# Patient Record
Sex: Female | Born: 1970 | Race: Black or African American | Hispanic: No | Marital: Single | Smoking: Current every day smoker
Health system: Southern US, Community
[De-identification: ages and names within clinical notes are randomized; demographics above are authoritative.]

## PROBLEM LIST (undated history)

## (undated) DIAGNOSIS — I10 Essential (primary) hypertension: Secondary | ICD-10-CM

## (undated) HISTORY — PX: OTHER SURGICAL HISTORY: SHX169

## (undated) HISTORY — PX: TUBAL LIGATION: SHX77

---

## 2004-07-10 ENCOUNTER — Encounter (INDEPENDENT_AMBULATORY_CARE_PROVIDER_SITE_OTHER): Payer: Self-pay | Admitting: *Deleted

## 2004-07-31 ENCOUNTER — Ambulatory Visit: Payer: Self-pay | Admitting: Family Medicine

## 2004-07-31 ENCOUNTER — Other Ambulatory Visit: Admission: RE | Admit: 2004-07-31 | Discharge: 2004-07-31 | Payer: Self-pay | Admitting: Family Medicine

## 2004-12-12 ENCOUNTER — Emergency Department (HOSPITAL_COMMUNITY): Admission: EM | Admit: 2004-12-12 | Discharge: 2004-12-12 | Payer: Self-pay | Admitting: Family Medicine

## 2004-12-19 ENCOUNTER — Ambulatory Visit: Payer: Self-pay | Admitting: Family Medicine

## 2005-01-04 ENCOUNTER — Emergency Department (HOSPITAL_COMMUNITY): Admission: EM | Admit: 2005-01-04 | Discharge: 2005-01-04 | Payer: Self-pay | Admitting: Family Medicine

## 2005-01-10 ENCOUNTER — Emergency Department (HOSPITAL_COMMUNITY): Admission: EM | Admit: 2005-01-10 | Discharge: 2005-01-10 | Payer: Self-pay | Admitting: Emergency Medicine

## 2005-06-13 ENCOUNTER — Ambulatory Visit: Payer: Self-pay | Admitting: Family Medicine

## 2005-10-18 ENCOUNTER — Ambulatory Visit: Payer: Self-pay | Admitting: Family Medicine

## 2005-10-18 ENCOUNTER — Other Ambulatory Visit: Admission: RE | Admit: 2005-10-18 | Discharge: 2005-10-18 | Payer: Self-pay | Admitting: Family Medicine

## 2005-12-24 ENCOUNTER — Emergency Department (HOSPITAL_COMMUNITY): Admission: EM | Admit: 2005-12-24 | Discharge: 2005-12-24 | Payer: Self-pay | Admitting: Family Medicine

## 2006-11-06 DIAGNOSIS — F17200 Nicotine dependence, unspecified, uncomplicated: Secondary | ICD-10-CM | POA: Insufficient documentation

## 2006-11-06 DIAGNOSIS — F3289 Other specified depressive episodes: Secondary | ICD-10-CM | POA: Insufficient documentation

## 2006-11-06 DIAGNOSIS — F329 Major depressive disorder, single episode, unspecified: Secondary | ICD-10-CM

## 2006-11-07 ENCOUNTER — Encounter (INDEPENDENT_AMBULATORY_CARE_PROVIDER_SITE_OTHER): Payer: Self-pay | Admitting: *Deleted

## 2007-10-30 ENCOUNTER — Emergency Department (HOSPITAL_COMMUNITY): Admission: EM | Admit: 2007-10-30 | Discharge: 2007-10-30 | Payer: Self-pay | Admitting: Emergency Medicine

## 2007-11-06 ENCOUNTER — Ambulatory Visit: Payer: Self-pay | Admitting: Internal Medicine

## 2015-10-29 ENCOUNTER — Encounter (HOSPITAL_COMMUNITY): Payer: Self-pay | Admitting: Emergency Medicine

## 2015-10-29 ENCOUNTER — Emergency Department (HOSPITAL_COMMUNITY)
Admission: EM | Admit: 2015-10-29 | Discharge: 2015-10-29 | Disposition: A | Discharge status: Home / Self Care | Payer: Managed Care, Other (non HMO) | Source: Home / Self Care | Attending: Family Medicine | Admitting: Family Medicine

## 2015-10-29 ENCOUNTER — Emergency Department (INDEPENDENT_AMBULATORY_CARE_PROVIDER_SITE_OTHER): Payer: Managed Care, Other (non HMO)

## 2015-10-29 DIAGNOSIS — M533 Sacrococcygeal disorders, not elsewhere classified: Secondary | ICD-10-CM | POA: Diagnosis not present

## 2015-10-29 DIAGNOSIS — S52201A Unspecified fracture of shaft of right ulna, initial encounter for closed fracture: Secondary | ICD-10-CM

## 2015-10-29 DIAGNOSIS — M25531 Pain in right wrist: Secondary | ICD-10-CM | POA: Diagnosis not present

## 2015-10-29 DIAGNOSIS — M25532 Pain in left wrist: Secondary | ICD-10-CM

## 2015-10-29 HISTORY — DX: Essential (primary) hypertension: I10

## 2015-10-29 MED ORDER — KETOROLAC TROMETHAMINE 30 MG/ML IJ SOLN
INTRAMUSCULAR | Status: AC
  Filled 2015-10-29: qty 1

## 2015-10-29 MED ORDER — NAPROXEN 500 MG PO TABS: 500.0000 mg | ORAL_TABLET | Freq: Two times a day (BID) | ORAL | Status: DC

## 2015-10-29 MED ORDER — KETOROLAC TROMETHAMINE 30 MG/ML IJ SOLN
30.0000 mg | Freq: Once | INTRAMUSCULAR | Status: AC
  Administered 2015-10-29: 30 mg via INTRAMUSCULAR

## 2015-10-29 NOTE — Discharge Instructions (Signed)
It is a pleasure to see you today.  The x-ray shows a fracture of the ulnar bone in your right wrist.  Splint to right wrist, follow up with Dr Coley/Hand Surgery in 1 week. Call for appointment.  Naproxen  tablets, take 1 tablet by mouth every 12 hours with something to eat.

## 2015-10-29 NOTE — ED Provider Notes (Addendum)
CSN: [id number]     Arrival date & time 10/29/15  1340 History   First MD Initiated Contact with Patient 10/29/15 1457     Chief Complaint  Patient presents with  . Wrist Pain  . Tailbone Pain  . Fall   (Consider location/radiation/quality/duration/timing/severity/associated sxs/prior Treatment) Patient is a 45 y.o. female presenting with wrist pain and fall. The history is provided by the patient. No language interpreter was used.  Wrist Pain  Fall  patient presents for bilateral wrist pain and coccygeal pain, which began after falling while roller skating at 5pm yesterday.  Fell backward, put hands back and fell onto backside.  Onset immediate pain in coccyx and R wrist; the pain in L wrist developed later in evening. Ice, tylenol and ibuprofen rotating since that time.  Today complains of continued pain both wrists, however R wrist is markedly worse than the L (but somewhat better than had been yesterday).   PMHx HTN, recently started on lisinopril for hypertension. Admitted to [facility] 2 weeks ago for walking pneumonia.  Reports that she has no medication allergies. Denies any known kidney disease, recent labs while at [facility].   Distant history fx R fifth metacarpal fracture, never required surgery.   Past Medical History  Diagnosis Date  . Hypertension    History reviewed. No pertinent past surgical history. History reviewed. No pertinent family history. Social History  Substance Use Topics  . Smoking status: Current Every Day Smoker -- 0.50 packs/day for 15 years    Types: Cigarettes  . Smokeless tobacco: Never Used  . Alcohol Use: No   OB History    No data available     Review of Systems  Constitutional: Negative for fever, chills, appetite change and fatigue.  HENT: Negative for congestion.   Respiratory: Negative for chest tightness.   Neurological: Negative for weakness.       Negative for urinary or bowel incontinence.   No loss of  strength in legs, no radiation of pain from back to legs.  No weakness in lower extremities. No saddle anesthesia.   All other systems reviewed and are negative.   Allergies  Review of patient's allergies indicates no known allergies.  Home Medications   Prior to Admission medications   Medication Sig Start Date End Date Taking? Authorizing Provider  budesonide-formoterol (SYMBICORT) 160-4.5 MCG/ACT inhaler Inhale 2 puffs into the lungs 2 (two) times daily.   Yes Historical Provider, MD  lamoTRIgine (LAMICTAL) 100 MG tablet Take 100 mg by mouth daily.   Yes Historical Provider, MD  lisinopril (PRINIVIL,ZESTRIL) 5 MG tablet Take 5 mg by mouth daily.   Yes Historical Provider, MD   Meds Ordered and Administered this Visit   Medications  ketorolac (TORADOL) 30 MG/ML injection 30 mg (not administered)    BP 162/101 mmHg  Pulse 94  Temp(Src) 98.6 F (37 C) (Oral)  Resp 17  SpO2 100%  LMP 10/18/2015 (Exact Date) No data found.   Physical Exam  Constitutional: She appears well-developed and well-nourished. No distress.  HENT:  Head: Normocephalic and atraumatic.  Neck: Normal range of motion. Neck supple.  Musculoskeletal:  RIGHT wrist with notable edema, unable to actively or passively pronate/supinate the wrist. Tenderness to palpate anatomic snuff box. Palpable radial pulses bilaterally.   LEFT wrist with slight edema of the wrist, less so than the right. Able to actively pronate/supinate the wrist.   Sensation in distal fingertips grossly intact and symmetric bilaterally. Hand grip stronger in L than R,  limitation due to pain.   COCCYGEAL tenderness with palpation over coccyx.   Able to stand/sit without limitation; strength hip flexion full and symmetric. Patellar DTRs 1+ symmetrically.   Neurological: She is alert.  Skin: She is not diaphoretic.    ED Course  Procedures (including critical care time)  Labs Review Labs Reviewed - No data to display  Imaging  Review No results found.   Visual Acuity Review  Right Eye Distance:   Left Eye Distance:   Bilateral Distance:    Right Eye Near:   Left Eye Near:    Bilateral Near:         MDM   1. Bilateral wrist pain   2. Coccygeal pain, acute    Patient s/p fall while skating, tenderness over anatomic snuff box bilaterally. X-rays of bilat wrists: x-ray shows nondisplaced R distal ulnar fracture; films reviewed by me.  Discussed case with Dr Izora Ribas by phone; plan for splint placement and follow up in his office in about 1 week.   Coccygeal pain, localized and without LE radiating symptoms.    Bilateral splints and injection administered.   Barbaraann Barthel, MD 10/29/15 1516  Barbaraann Barthel, MD 10/29/15 1610  Barbaraann Barthel, MD 01/26/16 1250

## 2015-10-29 NOTE — ED Notes (Signed)
The patient presented to the [facility] with a complaint of bilateral wrist pain and tailbone pain secondary to a fall that occurred today. The patient stated that she was skating and fell backwards and caught herself with both arms.

## 2017-02-09 IMAGING — DX DG WRIST COMPLETE 3+V*L*
4 series · 4 of 4 positions shown · non-contrast
Comparison: None.

CLINICAL DATA: Fell backwards yesterday while roller-skating with
bilateral wrist pain.

EXAM:
LEFT WRIST - COMPLETE 3+ VIEW

[wrist pa]
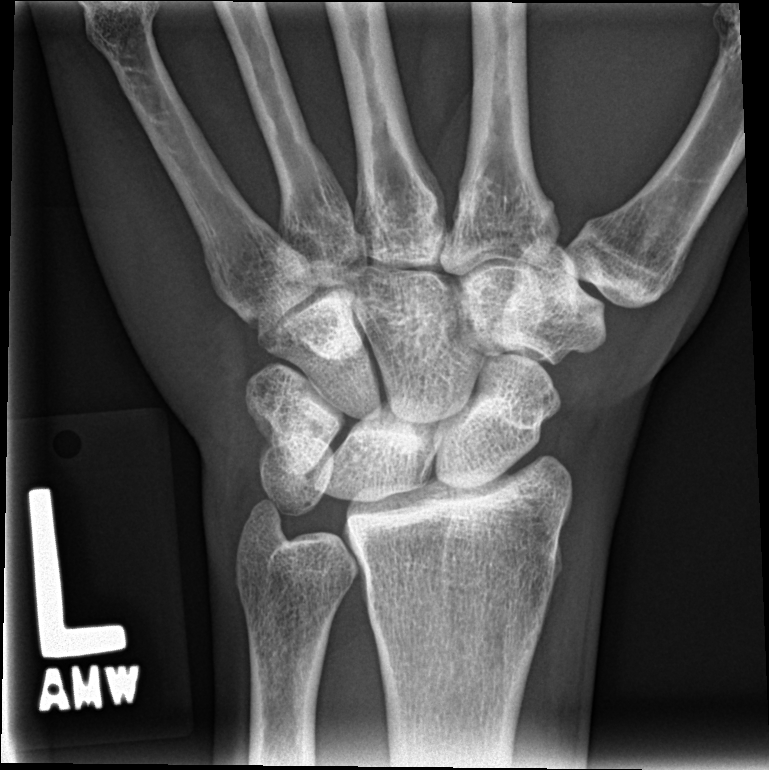

[wrist navicular]
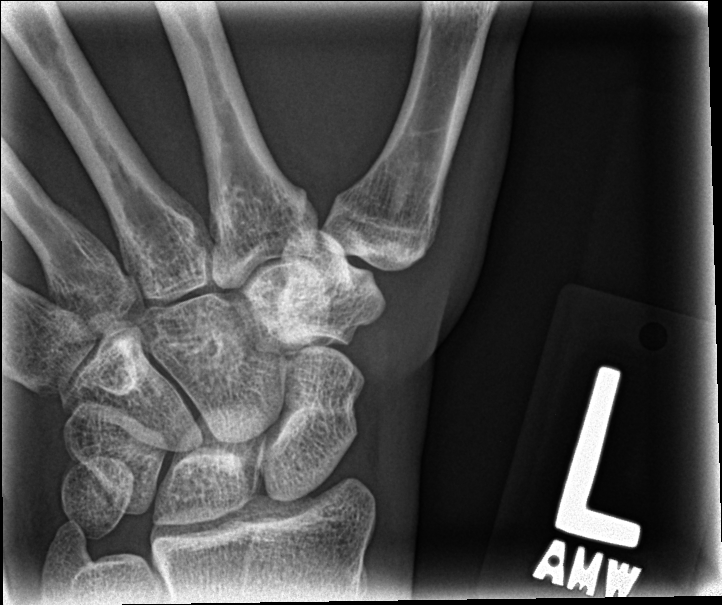

[wrist obl]
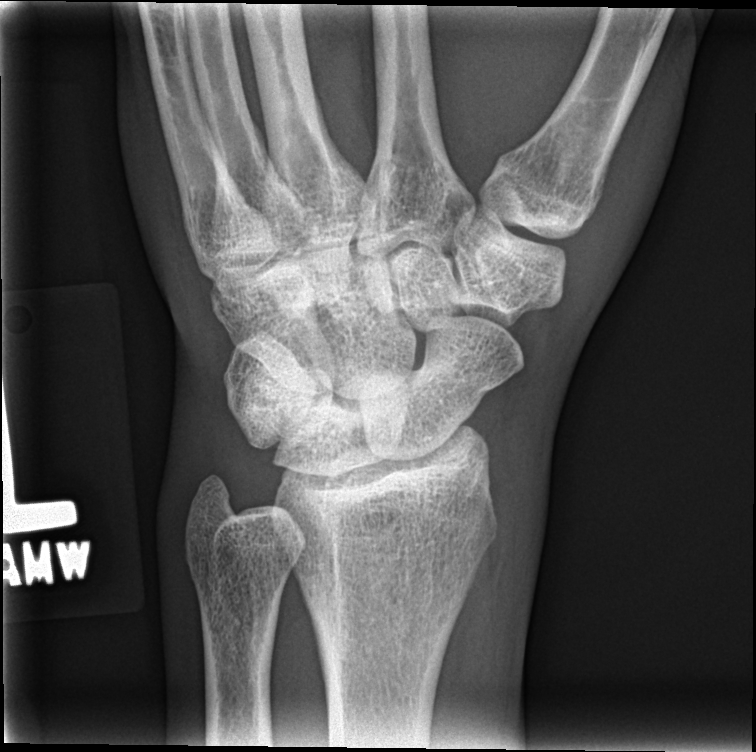

[wrist lat]
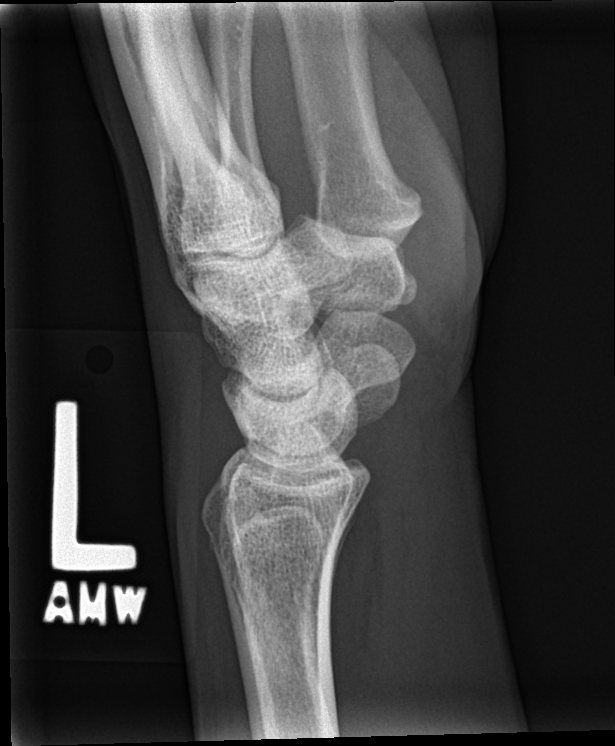

[4 of 4 positions shown; findings below may reference images not displayed]

FINDINGS: Mild degenerative change over the radiocarpal joint. No evidence of
fracture dislocation.
IMPRESSION: No acute findings.

## 2020-08-30 ENCOUNTER — Emergency Department (HOSPITAL_COMMUNITY)
Admission: EM | Admit: 2020-08-30 | Discharge: 2020-08-30 | Disposition: A | Discharge status: Home / Self Care | Payer: Medicaid - Out of State | Attending: Emergency Medicine | Admitting: Emergency Medicine

## 2020-08-30 ENCOUNTER — Other Ambulatory Visit: Payer: Self-pay

## 2020-08-30 ENCOUNTER — Encounter (HOSPITAL_COMMUNITY): Payer: Self-pay | Admitting: Emergency Medicine

## 2020-08-30 DIAGNOSIS — R0602 Shortness of breath: Secondary | ICD-10-CM | POA: Diagnosis present

## 2020-08-30 DIAGNOSIS — Z79899 Other long term (current) drug therapy: Secondary | ICD-10-CM | POA: Diagnosis not present

## 2020-08-30 DIAGNOSIS — J441 Chronic obstructive pulmonary disease with (acute) exacerbation: Secondary | ICD-10-CM | POA: Insufficient documentation

## 2020-08-30 DIAGNOSIS — F17210 Nicotine dependence, cigarettes, uncomplicated: Secondary | ICD-10-CM | POA: Insufficient documentation

## 2020-08-30 DIAGNOSIS — I10 Essential (primary) hypertension: Secondary | ICD-10-CM | POA: Diagnosis not present

## 2020-08-30 MED ORDER — PREDNISONE 20 MG PO TABS
60.0000 mg | ORAL_TABLET | Freq: Once | ORAL | Status: AC
Start: 2020-08-30 — End: 2020-08-30
  Administered 2020-08-30: 60 mg via ORAL
  Filled 2020-08-30: qty 3

## 2020-08-30 MED ORDER — HYDROCODONE-ACETAMINOPHEN 5-325 MG PO TABS: 2.0000 | ORAL_TABLET | Freq: Once | ORAL | Status: DC

## 2020-08-30 MED ORDER — PREDNISONE 20 MG PO TABS: 40.0000 mg | ORAL_TABLET | Freq: Every day | ORAL | 0 refills | Status: AC

## 2020-08-30 MED ORDER — IPRATROPIUM-ALBUTEROL 0.5-2.5 (3) MG/3ML IN SOLN
3.0000 mL | Freq: Once | RESPIRATORY_TRACT | Status: AC
  Administered 2020-08-30: 3 mL via RESPIRATORY_TRACT
  Filled 2020-08-30: qty 3

## 2020-08-30 NOTE — ED Provider Notes (Signed)
[name] Provider Note   CSN: [id number] Arrival date & time: 08/30/20  0012     History Chief Complaint  Patient presents with  . Shortness of Breath    Terri Paul is a 49 y.o. female.  Patient presents to the emergency department with a chief complaint of shortness of breath and wheezing.  She has history of COPD.  She states that she has been out of her Symbicort for the past several weeks.  She has been trying to control her symptoms with her rescue inhaler.  She states that when she ambulates her oxygen level drops and she becomes very winded.  She denies any fevers or chills.  Denies any new productive cough.  She states she does have some chronic cough with clear sputum.  This is unchanged.  The history is provided by the patient. No language interpreter was used.       Past Medical History:  Diagnosis Date  . Hypertension     Patient Active Problem List   Diagnosis Date Noted  . TOBACCO DEPENDENCE 11/06/2006  . DEPRESSIVE DISORDER, NOS 11/06/2006    History reviewed. No pertinent surgical history.   OB History   No obstetric history on file.     History reviewed. No pertinent family history.  Social History   Tobacco Use  . Smoking status: Current Every Day Smoker    Packs/day: 0.50    Years: 15.00    Pack years: 7.50    Types: Cigarettes  . Smokeless tobacco: Never Used  Substance Use Topics  . Alcohol use: No  . Drug use: No    Home Medications Prior to Admission medications   Medication Sig Start Date End Date Taking? Authorizing Provider  budesonide-formoterol (SYMBICORT) 160-4.5 MCG/ACT inhaler Inhale 2 puffs into the lungs 2 (two) times daily.    [provider]  lamoTRIgine (LAMICTAL) 100 MG tablet Take 100 mg by mouth daily.    [provider]  lisinopril (PRINIVIL,ZESTRIL) 5 MG tablet Take 5 mg by mouth daily.    [provider]  naproxen (NAPROSYN) 500 MG tablet  Take 1 tablet (500 mg total) by mouth 2 (two) times daily with a meal. 10/29/15   Barbaraann Barthel, MD    Allergies    Patient has no known allergies.  Review of Systems   Review of Systems  All other systems reviewed and are negative.   Physical Exam Updated Vital Signs BP (!) 152/104   Pulse 88   Temp 98.5 F (36.9 C) (Oral)   Resp 18   Ht 5\' 7"  (1.702 m)   Wt 54.4 kg   SpO2 95%   BMI 18.79 kg/m   Physical Exam Vitals and nursing note reviewed.  Constitutional:      General: She is not in acute distress.    Appearance: She is well-developed and well-nourished.  HENT:     Head: Normocephalic and atraumatic.  Eyes:     Conjunctiva/sclera: Conjunctivae normal.  Cardiovascular:     Rate and Rhythm: Normal rate and regular rhythm.     Heart sounds: No murmur heard.   Pulmonary:     Effort: Pulmonary effort is normal. No respiratory distress.     Breath sounds: Wheezing present.  Abdominal:     Palpations: Abdomen is soft.     Tenderness: There is no abdominal tenderness.  Musculoskeletal:        General: No edema.     Cervical back: Neck supple.  Skin:    General: Skin is warm and dry.  Neurological:     Mental Status: She is alert.  Psychiatric:        Mood and Affect: Mood and affect normal.     ED Results / Procedures / Treatments   Labs (all labs ordered are listed, but only abnormal results are displayed) Labs Reviewed - No data to display  EKG None  Radiology No results found.  Procedures Procedures (including critical care time)  Medications Ordered in ED Medications  ipratropium-albuterol (DUONEB) 0.5-2.5 (3) MG/3ML nebulizer solution 3 mL (has no administration in time range)  predniSONE (DELTASONE) tablet 60 mg (has no administration in time range)    ED Course  I have reviewed the triage vital signs and the nursing notes.  Pertinent labs & imaging results that were available during my care of the patient were reviewed by me and  considered in my medical decision making (see chart for details).    MDM Rules/Calculators/A&P                          Patient here with COPD exacerbation.  She has been having gradually worsening symptoms for the past week or so.  She is out of her Symbicort.  She does have diffuse wheezing on exam.  O2 saturation has been between 90 to 95%.  Will give breathing treatment and prednisone.  Patient feels significantly improved after breathing treatment.  Wheezing is diminished.  O2 saturation is 99%.  She looks more comfortable.  She is afebrile.  We will plan for discharge with some prednisone and continued use of rescue inhaler as needed.  Return precautions discussed.  Patient understands agrees with plan.  She is stable ready for discharge.   Final Clinical Impression(s) / ED Diagnoses Final diagnoses:  COPD exacerbation (HCC)    Rx / DC Orders ED Discharge Orders         Ordered    predniSONE (DELTASONE) 20 MG tablet  Daily        08/30/20 0609           Roxy Horseman, PA-C 08/30/20 5361    Marily Memos, MD 08/30/20 [phone]

## 2020-08-30 NOTE — ED Triage Notes (Addendum)
Pt reports COPD exacerbation since last Wednesday. She states that she is out of her symbicort and is waiting on insurance to get her medications. No distress noted. States that she gets winded when she is active.

## 2021-06-14 ENCOUNTER — Emergency Department (HOSPITAL_COMMUNITY)
Admission: EM | Admit: 2021-06-14 | Discharge: 2021-06-14 | Disposition: A | Discharge status: Home / Self Care | Payer: 59 | Attending: Emergency Medicine | Admitting: Emergency Medicine

## 2021-06-14 ENCOUNTER — Other Ambulatory Visit: Payer: Self-pay

## 2021-06-14 ENCOUNTER — Encounter (HOSPITAL_COMMUNITY): Payer: Self-pay

## 2021-06-14 DIAGNOSIS — N8110 Cystocele, unspecified: Secondary | ICD-10-CM | POA: Insufficient documentation

## 2021-06-14 DIAGNOSIS — I10 Essential (primary) hypertension: Secondary | ICD-10-CM | POA: Insufficient documentation

## 2021-06-14 DIAGNOSIS — Z008 Encounter for other general examination: Secondary | ICD-10-CM

## 2021-06-14 DIAGNOSIS — Z79899 Other long term (current) drug therapy: Secondary | ICD-10-CM | POA: Insufficient documentation

## 2021-06-14 DIAGNOSIS — F17210 Nicotine dependence, cigarettes, uncomplicated: Secondary | ICD-10-CM | POA: Diagnosis not present

## 2021-06-14 LAB — POC URINE PREG, ED: Preg Test, Ur: NEGATIVE

## 2021-06-14 NOTE — ED Triage Notes (Signed)
Pt states that her uterus is coming out of her vagina. Pt states that this has happened 3x yesterday and has been happening since last Thursday. Pt complains of cramping and light bleeding.

## 2021-06-14 NOTE — Discharge Instructions (Addendum)
Please follow up with [facility] for further evaluation of possible bladder/uterine prolapse. Call to make an appointment.

## 2021-06-14 NOTE — ED Provider Notes (Signed)
[facility]-EMERGENCY DEPT Provider Note   CSN: [id number] Arrival date & time: 06/14/21  0056     History Chief Complaint  Patient presents with   Vaginal Prolapse    Terri Paul is a 50 y.o. female with PMHx HTN who presents to the ED today with concern for possible vaginal prolapse. Patient reports that a couple days ago she was straining on the toilet to have a bowel movement.  Afterward she felt a fullness in her pelvic area.  She states that this dissipated after wire however over the past couple of days she has continued to feel intermittent fullness.  She had her daughter take a look and there was concern that she was having vaginal prolapse.  She came to the ED today for further evaluation.  She does report she had a small amount of bleeding to this area a couple of days ago however none since then.  She reports her last normal menstrual period was sometime in April and she is due that she was going through menopause.  She does not have a OB/GYN to follow-up with at this time.  She denies any abdominal pain.  No nausea or vomiting.  The history is provided by the patient and medical records.      Past Medical History:  Diagnosis Date   Hypertension     Patient Active Problem List   Diagnosis Date Noted   TOBACCO DEPENDENCE 11/06/2006   DEPRESSIVE DISORDER, NOS 11/06/2006    History reviewed. No pertinent surgical history.   OB History   No obstetric history on file.     History reviewed. No pertinent family history.  Social History   Tobacco Use   Smoking status: Every Day    Packs/day: 0.50    Years: 15.00    Pack years: 7.50    Types: Cigarettes   Smokeless tobacco: Never  Substance Use Topics   Alcohol use: No   Drug use: No    Home Medications Prior to Admission medications   Medication Sig Start Date End Date Taking? Authorizing Provider  amLODipine (NORVASC) 5 MG tablet Take 5 mg by mouth daily.    [provider]  budesonide-formoterol (SYMBICORT) 160-4.5 MCG/ACT inhaler Inhale 2 puffs into the lungs 2 (two) times daily.    [provider]  clonazePAM (KLONOPIN) 0.5 MG tablet Take 0.5 mg by mouth in the morning and at bedtime. 10/28/19   [provider]  lamoTRIgine (LAMICTAL) 100 MG tablet Take 100 mg by mouth daily.    [provider]  lamoTRIgine (LAMICTAL) 25 MG tablet Take 50-75 mg by mouth See admin instructions. 75 mg in the AM along with 100 MG and 50 MG in the evening 10/28/19   [provider]  lisinopril (PRINIVIL,ZESTRIL) 5 MG tablet Take 5 mg by mouth daily.    [provider]  naproxen (NAPROSYN) 500 MG tablet Take 1 tablet (500 mg total) by mouth 2 (two) times daily with a meal. Patient not taking: No sig reported 10/29/15   Barbaraann Barthel, MD  predniSONE (DELTASONE) 20 MG tablet Take 2 tablets (40 mg total) by mouth daily. 08/30/20   Roxy Horseman, PA-C    Allergies    Patient has no known allergies.  Review of Systems   Review of Systems  Constitutional:  Negative for chills and fever.  Gastrointestinal:  Negative for abdominal pain, nausea and vomiting.  Genitourinary:  Positive for menstrual problem and vaginal pain.  All other  systems reviewed and are negative.  Physical Exam Updated Vital Signs BP (!) 191/107 (BP Location: Left Arm)   Pulse 68   Temp 98.6 F (37 C) (Oral)   Resp 20   Ht 5\' 7"  (1.702 m)   Wt 54.4 kg   SpO2 100%   BMI 18.79 kg/m   Physical Exam Vitals and nursing note reviewed.  Constitutional:      Appearance: She is not ill-appearing.  HENT:     Head: Normocephalic and atraumatic.  Eyes:     Conjunctiva/sclera: Conjunctivae normal.  Cardiovascular:     Rate and Rhythm: Normal rate and regular rhythm.  Pulmonary:     Effort: Pulmonary effort is normal.     Breath sounds: Normal breath sounds.  Abdominal:     Palpations: Abdomen is soft.     Tenderness: There is no abdominal  tenderness.  Genitourinary:    Comments: Chaperone present for exam. No obvious vaginal or bladder prolapse appreciated at this time. Speculum insert into the vaginal vault without difficulty. Os is closed. No discharge in vault. No adnexal or CMT with manual exam.  Musculoskeletal:     Cervical back: Neck supple.  Skin:    General: Skin is warm and dry.  Neurological:     Mental Status: She is alert.    ED Results / Procedures / Treatments   Labs (all labs ordered are listed, but only abnormal results are displayed) Labs Reviewed  POC URINE PREG, ED    EKG None  Radiology No results found.  Procedures Procedures   Medications Ordered in ED Medications - No data to display  ED Course  I have reviewed the triage vital signs and the nursing notes.  Pertinent labs & imaging results that were available during my care of the patient were reviewed by me and considered in my medical decision making (see chart for details).    MDM Rules/Calculators/A&P                           50 year old female who presents to the ED today for concern for vaginal prolapse.  Has been having a fullness in her vaginal area over the past couple of days that has become more frequent in nature.  On arrival to the ED vitals are stable.  On my exam with chaperone at bedside there is no obvious bladder or uterine/vaginal prolapse.  Speculum easily inserted into the vaginal vault without tenderness palpation.  She has no obvious vaginal discharge.  Bimanual exam is unremarkable.  She is describing what is likely bladder prolapse.  Have instructed that she follow-up with OB/GYN for further eval she likely needs pessary placement.  She is in agreement with plan at this time and stable for discharge home.  This note was prepared using Dragon voice recognition software and may include unintentional dictation errors due to the inherent limitations of voice recognition software.   Final Clinical Impression(s)  / ED Diagnoses Final diagnoses:  Encounter for medical assessment    Rx / DC Orders ED Discharge Orders     None        Discharge Instructions      Please follow up with [facility] for further evaluation of possible bladder/uterine prolapse. Call to make an appointment.          [facility], PA-C 06/14/21 08/14/21    1610, MD 06/14/21 [phone]

## 2021-07-23 ENCOUNTER — Encounter: Payer: Self-pay | Admitting: Obstetrics and Gynecology

## 2021-07-23 ENCOUNTER — Other Ambulatory Visit: Payer: Self-pay

## 2021-07-23 ENCOUNTER — Ambulatory Visit (INDEPENDENT_AMBULATORY_CARE_PROVIDER_SITE_OTHER): Payer: 59 | Admitting: Obstetrics and Gynecology

## 2021-07-23 DIAGNOSIS — N3941 Urge incontinence: Secondary | ICD-10-CM | POA: Diagnosis not present

## 2021-07-23 MED ORDER — SOLIFENACIN SUCCINATE 5 MG PO TABS: 5.0000 mg | ORAL_TABLET | Freq: Every day | ORAL | 5 refills | Status: AC

## 2021-07-23 NOTE — Progress Notes (Signed)
Ms Moffat presents for evaluation of possible vaginal/uterine prolapse. Pt states she had to "pus something back up " this past Sept. She reports that she has not had to do so since. She reports some vaginal fullness and pressure at times. Problems with urge incont for some time now. Wears a pad at times for comfort. Voids over 10 times a day and 2-5 times at night. Improved with less consumption of caffeine. Some problems with constipation as well. Cycles have become more irregular this last year. Some menopausal Sx as well. Able to handle. Not sexual active.  Pap smear and mammogram this past March with [facility] per pt.  H/O TSVD x 3, largest 9# 3 oz.  H/O SAB x 2 Smoker, HTN and Bipolar follwed and managed by PCP.  PE AF VSS    Chaperone present during exam Lungs clear Heart RRR Abd soft + BS  GU Nl EGBUS, no evidence of prolapse in supine position with val salva, cervix no lesions, bladder non tender, uterus small, < 8 weeks, mobile, non tender, no masses, levator muscles mildly attenuated, rectal confirms  A/P Urge incont        Reviewed with pt, no evidence of frank prolapse on today's exam. Will start Vesicare for urge incont. U/R/B reviewed with pt..Advised to d/c smoking. Return to work note provided today. F/U in 2 months

## 2021-07-23 NOTE — Patient Instructions (Signed)
Urinary Incontinence °Urinary incontinence refers to a condition in which a person is unable to control where and when to pass urine. A person with this condition will urinate involuntarily. This means that the person urinates when he or she does not mean to. °What are the causes? °This condition may be caused by: °Medicines. °Infections. °Constipation. °Overactive bladder muscles. °Weak bladder muscles. °Weak pelvic floor muscles. These muscles provide support for the bladder, intestine, and, in women, the uterus. °Enlarged prostate in men. The prostate is a gland near the bladder. When it gets too big, it can pinch the urethra. With the urethra blocked, the bladder can weaken and lose the ability to empty properly. °Surgery. °Emotional factors, such as anxiety, stress, or post-traumatic stress disorder (PTSD). °Spinal cord injury, nerve injury, or other neurological conditions. °Pelvic organ prolapse. This happens in women when organs move out of place and into the vagina. This movement can prevent the bladder and urethra from working properly. °What increases the risk? °The following factors may make you more likely to develop this condition: °Age. The older you are, the higher the risk. °Obesity. °Being physically inactive. °Pregnancy and childbirth. °Menopause. °Diseases that affect the nerves or spinal cord. °Long-term, or chronic, coughing. This can increase pressure on the bladder and pelvic floor muscles. °What are the signs or symptoms? °Symptoms may vary depending on the type of urinary incontinence you have. They include: °A sudden urge to urinate, and passing urine involuntarily before you can get to a bathroom (urge incontinence). °Suddenly passing urine when doing activities that force urine to pass, such as coughing, laughing, exercising, or sneezing (stress incontinence). °Needing to urinate often but urinating only a small amount, or constantly dribbling urine (overflow incontinence). °Urinating  because you cannot get to the bathroom in time due to a physical disability, such as arthritis or injury, or due to a communication or thinking problem, such as Alzheimer's disease (functional incontinence). °How is this diagnosed? °This condition may be diagnosed based on: °Your medical history. °A physical exam. °Tests, such as: °Urine tests. °X-rays of your kidney and bladder. °Ultrasound. °CT scan. °Cystoscopy. In this procedure, a health care provider inserts a tube with a light and camera (cystoscope) through the urethra and into the bladder to check for problems. °Urodynamic testing. These tests assess how well the bladder, urethra, and sphincter can store and release urine. There are different types of urodynamic tests, and they vary depending on what the test is measuring. °To help diagnose your condition, your health care provider may recommend that you keep a log of when you urinate and how much you urinate. °How is this treated? °Treatment for this condition depends on the type of incontinence that you have and its cause. Treatment may include: °Lifestyle changes, such as: °Quitting smoking. °Maintaining a healthy weight. °Staying active. Try to get 150 minutes of moderate-intensity exercise every week. Ask your health care provider which activities are safe for you. °Eating a healthy diet. °Avoid high-fat foods, like fried foods. °Avoid refined carbohydrates like white bread and white rice. °Limit how much alcohol and caffeine you drink. °Increase your fiber intake. Healthy sources of fiber include beans, whole grains, and fresh fruits and vegetables. °Behavioral changes, such as: °Pelvic floor muscle exercises. °Bladder training, such as lengthening the amount of time between bathroom breaks, or using the bathroom at regular intervals. °Using techniques to suppress bladder urges. This can include distraction techniques or controlled breathing exercises. °Medicines, such as: °Medicines to relax the  bladder   muscles and prevent bladder spasms. °Medicines to help slow or prevent the growth of a man's prostate. °Botox injections. These can help relax the bladder muscles. °Treatments, such as: °Using pulses of electricity to help change bladder reflexes (electrical nerve stimulation). °For women, using a medical device to prevent urine leaks. This is a small, tampon-like, disposable device that is inserted into the urethra. °Injecting collagen or carbon beads (bulking agents) into the urinary sphincter. These can help thicken tissue and close the bladder opening. °Surgery. °Follow these instructions at home: °Lifestyle °Limit alcohol and caffeine. These can fill your bladder quickly and irritate it. °Keep yourself clean to help prevent odors and skin damage. Ask your health care provider about special skin creams and cleansers that can protect the skin from urine. °Consider wearing pads or adult diapers. Make sure to change them regularly, and always change them right after experiencing incontinence. °General instructions °Take over-the-counter and prescription medicines only as told by your health care provider. °Use the bathroom about every 3-4 hours, even if you do not feel the need to urinate. Try to empty your bladder completely every time. After urinating, wait a minute. Then try to urinate again. °Make sure you are in a relaxed position while urinating. °If your incontinence is caused by nerve problems, keep a log of the medicines you take and the times you go to the bathroom. °Keep all follow-up visits. This is important. °Where to find more information °[employer] and [employer]estive and Kid[employer] Diseases: www.niddk.nih.gov °[employer]: www.urologyhealth.org °Contact a health care provider if: °You have pain that gets worse. °Your incontinence gets worse. °Get help right away if: °You have a fever or chills. °You are unable to urinate. °You have redness in your groin area or  down your legs. °Summary °Urinary incontinence refers to a condition in which a person is unable to control where and when to pass urine. °This condition may be caused by medicines, infection, weak bladder muscles, weak pelvic floor muscles, enlargement of the prostate (in men), or surgery. °Factors such as older age, obesity, pregnancy and childbirth, menopause, neurological diseases, and chronic coughing may increase your risk for developing this condition. °Types of urinary incontinence include urge incontinence, stress incontinence, overflow incontinence, and functional incontinence. °This condition is usually treated first with lifestyle and behavioral changes, such as quitting smoking, eating a healthier diet, and doing regular pelvic floor exercises. Other treatment options include medicines, bulking agents, medical devices, electrical nerve stimulation, or surgery. °This information is not intended to replace advice given to you by your health care provider. Make sure you discuss any questions you have with your health care provider. °Document Revised: 03/31/2020 Document Reviewed: 03/31/2020 °[employer] Patient Education © 2022 [employer] ° °

## 2021-09-27 ENCOUNTER — Ambulatory Visit: Payer: 59 | Admitting: Obstetrics and Gynecology

## 2021-10-15 ENCOUNTER — Ambulatory Visit: Payer: 59 | Admitting: Obstetrics and Gynecology
